# Patient Record
Sex: Male | Born: 1975 | Race: White | Hispanic: No | Marital: Single | Smoking: Former smoker
Health system: Southern US, Community
[De-identification: ages and names within clinical notes are randomized; demographics above are authoritative.]

## PROBLEM LIST (undated history)

## (undated) HISTORY — PX: BACK SURGERY: SHX140

---

## 1998-04-13 ENCOUNTER — Emergency Department (HOSPITAL_COMMUNITY): Admission: EM | Admit: 1998-04-13 | Discharge: 1998-04-13 | Payer: Self-pay | Admitting: Emergency Medicine

## 1999-04-23 ENCOUNTER — Emergency Department (HOSPITAL_COMMUNITY): Admission: EM | Admit: 1999-04-23 | Discharge: 1999-04-23 | Payer: Self-pay | Admitting: *Deleted

## 1999-04-24 ENCOUNTER — Encounter: Payer: Self-pay | Admitting: Emergency Medicine

## 1999-09-20 ENCOUNTER — Emergency Department (HOSPITAL_COMMUNITY): Admission: EM | Admit: 1999-09-20 | Discharge: 1999-09-20 | Payer: Self-pay | Admitting: Emergency Medicine

## 1999-09-20 ENCOUNTER — Encounter: Payer: Self-pay | Admitting: Emergency Medicine

## 1999-09-30 ENCOUNTER — Encounter: Payer: Self-pay | Admitting: Emergency Medicine

## 1999-09-30 ENCOUNTER — Inpatient Hospital Stay (HOSPITAL_COMMUNITY): Admission: EM | Admit: 1999-09-30 | Discharge: 1999-10-03 | Payer: Self-pay | Admitting: Emergency Medicine

## 1999-10-01 ENCOUNTER — Encounter: Payer: Self-pay | Admitting: *Deleted

## 2000-03-28 ENCOUNTER — Emergency Department (HOSPITAL_COMMUNITY): Admission: EM | Admit: 2000-03-28 | Discharge: 2000-03-28 | Payer: Self-pay | Admitting: Emergency Medicine

## 2000-03-28 ENCOUNTER — Encounter: Payer: Self-pay | Admitting: Emergency Medicine

## 2001-01-17 ENCOUNTER — Encounter: Admission: RE | Admit: 2001-01-17 | Discharge: 2001-01-17 | Payer: Self-pay | Admitting: Family Medicine

## 2001-01-17 ENCOUNTER — Encounter: Payer: Self-pay | Admitting: Family Medicine

## 2002-06-12 ENCOUNTER — Emergency Department (HOSPITAL_COMMUNITY): Admission: EM | Admit: 2002-06-12 | Discharge: 2002-06-12 | Payer: Self-pay | Admitting: Emergency Medicine

## 2004-05-21 ENCOUNTER — Emergency Department (HOSPITAL_COMMUNITY): Admission: EM | Admit: 2004-05-21 | Discharge: 2004-05-21 | Payer: Self-pay | Admitting: Emergency Medicine

## 2004-10-25 ENCOUNTER — Emergency Department (HOSPITAL_COMMUNITY): Admission: EM | Admit: 2004-10-25 | Discharge: 2004-10-25 | Payer: Self-pay | Admitting: Family Medicine

## 2007-05-02 ENCOUNTER — Emergency Department (HOSPITAL_COMMUNITY): Admission: EM | Admit: 2007-05-02 | Discharge: 2007-05-02 | Payer: Self-pay | Admitting: Family Medicine

## 2008-07-03 ENCOUNTER — Emergency Department (HOSPITAL_COMMUNITY): Admission: EM | Admit: 2008-07-03 | Discharge: 2008-07-03 | Payer: Self-pay | Admitting: Family Medicine

## 2008-07-14 ENCOUNTER — Emergency Department (HOSPITAL_COMMUNITY): Admission: EM | Admit: 2008-07-14 | Discharge: 2008-07-14 | Payer: Self-pay | Admitting: Emergency Medicine

## 2008-09-22 ENCOUNTER — Ambulatory Visit (HOSPITAL_COMMUNITY): Admission: RE | Admit: 2008-09-22 | Discharge: 2008-09-22 | Payer: Self-pay | Admitting: Family Medicine

## 2008-10-20 ENCOUNTER — Emergency Department: Payer: Self-pay | Admitting: Unknown Physician Specialty

## 2009-04-04 ENCOUNTER — Emergency Department (HOSPITAL_COMMUNITY): Admission: EM | Admit: 2009-04-04 | Discharge: 2009-04-04 | Payer: Self-pay | Admitting: Family Medicine

## 2009-05-28 ENCOUNTER — Emergency Department (HOSPITAL_COMMUNITY): Admission: EM | Admit: 2009-05-28 | Discharge: 2009-05-29 | Payer: Self-pay | Admitting: Emergency Medicine

## 2009-05-29 ENCOUNTER — Emergency Department (HOSPITAL_COMMUNITY): Admission: EM | Admit: 2009-05-29 | Discharge: 2009-05-29 | Payer: Self-pay | Admitting: Emergency Medicine

## 2009-09-08 ENCOUNTER — Emergency Department (HOSPITAL_COMMUNITY): Admission: EM | Admit: 2009-09-08 | Discharge: 2009-09-08 | Payer: Self-pay | Admitting: Family Medicine

## 2009-09-09 ENCOUNTER — Encounter: Admission: RE | Admit: 2009-09-09 | Discharge: 2009-09-09 | Payer: Self-pay | Admitting: Specialist

## 2010-12-05 LAB — POCT I-STAT, CHEM 8
BUN: 10 mg/dL (ref 6–23)
Creatinine, Ser: 0.8 mg/dL (ref 0.4–1.5)
Glucose, Bld: 99 mg/dL (ref 70–99)
Potassium: 3.8 mEq/L (ref 3.5–5.1)
Sodium: 142 mEq/L (ref 135–145)
TCO2: 24 mmol/L (ref 0–100)

## 2011-01-16 NOTE — H&P (Signed)
[place]. [facility]  Patient:    Steven Kramer                        MRN: [id number] Adm. Date:  [id number] Attending:  Sharyn Dross                         History and Physical  HISTORY OF PRESENT ILLNESS:  This 35 year old white gentleman is admitted into he hospital with a history of fever, persistent nausea and vomiting, and generalized pains and weakness ongoing. The patient presented to the office on January 30 for an evaluation for this process. He states that the periods of symptoms have been ongoing for the past several months. Prior to that, the patient had been on intramuscular steroid injections for weight loss control as well as body building process that was ongoing. The patient was doing this as well as ______  food products to lose weight. When the process of what was ongoing, the patient had ost at least 75 pounds of weight that was noted.  Approximately four months ago, the patient had given himself an injection in the arm which caused complication for the patient. Subsequently, he stopped using intramuscular steroid medications. Subsequently, he started to develop severe discomfort such as headaches, dizziness, fevers, and chills, as well as nausea nd vomiting that is present. The patient was seen by the emergency room physician n the past and was treated with medications, such as intravenous Valium and other  medications. The patient stated that these medicines had not helped, and his symptoms have not improved or get any better at that time. An appointment was made to see me in my office at this time at which point the patient explains this scenario. The wife subsequently explained later that the patient has not stopped his steroids but is taking oral steroids at which he is even recently taken.  There is no significant change in his mental status or any violent action or reaction is noted.  FAMILY HISTORY:   Noncontributory.  SOCIAL HISTORY:  The patient admits to smoking a half of a pack of cigarettes per day over the last 4 years and also the patient denies alcohol use that was present at this time.  CURRENT MEDICATIONS:  The patient does not take any medications that is presently noted at this time.  REVIEW OF SYSTEMS:  Positive for severe chest pains and discomfort over the last four months duration without treatment. The patient denies any pulmonary problems. Neurologically, the patient admits to headaches and dizziness. MUSCULOSKELETAL: The patient admits to having severe joint discomforts and problems secondary to withdrawal. The rest of the review of systems appears to be unremarkable, except insomnia.  PHYSICAL EXAMINATION:  GENERAL:  He is a pleasant male appears to be in moderate distress at the time f admission.  VITAL SIGNS:  Stable, except a temperature of 100 degrees.  HEENT:  Anicteric.  NECK:  Supple.  LUNGS:  Appear to be clear to auscultation and percussion.  HEART:  Regular rate and rhythm without heaves, thrills, murmurs or gallops. There was no tachycardia that was noted.  ABDOMEN:  Soft. No tenderness to palpation. No hepatosplenomegaly appreciated.  EXTREMITIES:  Unremarkable.  LABORATORY DATA:  Showed a pH of 7.534, a pCO2 of 19.3, bicarb of 16. A white count of 4.7, red blood cell count 4.99, hemoglobin 14.8, hematocrit 41.6, normal indices noted. Platelet count is 182,000.  Serum sodium level is 138, potassium 4.5, chloride 108, glucose 92, BUN 12, amylase level 90, lipase 27. CPK 267 (abnormal), CK-MB 0.6, troponin I less than 0.03 that is noted.  Chest x-ray shows no active process that is present but a repeat x-ray was consistent with possible bronchitis that is present due to the prominence of a perihilar markings that were appreciated.  GROSS IMPRESSION: 1. Fever. Etiology unknown. Rule out possible sepsis. 2. Possible steroid  withdrawal. 3. Bronchitis.  PRESENT RECOMMENDATIONS: 1. Will admit the patient into the hospital. 2. Start IV therapy on the patient. 3. Obtain a GI series to find out if symptoms are related to gastroesophageal    reflux disease versus other causes. 4. Conservative management during the interim. 5. Decrease or hold on any specific analgesic medication because I do not want o    treat the psychosis or possible withdrawal symptoms with analgesic types of    medications at this time. Depending upon the results, will determine the course    of therapy for the patient. DD:  10/01/99 TD:  10/01/99 Job: [id number]

## 2011-01-16 NOTE — Discharge Summary (Signed)
[place]. [facility]  Patient:    Steven Kramer                        MRN: [id number] Adm. Date:  [id number] Disc. Date: [id number] Attending:  Sharyn Dross                           Discharge Summary  ADMISSION DIAGNOSES: 1. Fever. 2. Steroid withdrawal symptoms.  DISCHARGE DIAGNOSES: 1. Fever. 2. Steroid withdrawal symptoms.  CONDITION ON DISCHARGE:  Stable, improved.  DISCHARGE MEDICATIONS: 1. Elavil 50 mg IV q.h.s. 2. Levaquin 500 mg q.d. for seven days.  COMPLICATIONS:  None at this time.  HISTORY OF PRESENT ILLNESS:  The patient was admitted into the hospital after being extremely agitated and hyperactive at this time.  His initial laboratory work was performed, and the patient was afebrile; however, because the patient was having such severe discomfort, he was sent to the emergency room that evening.  Upon evaluation by the emergency room department, it was decided to admit the patient into the hospital because of a fever that he was having at that time.  HOSPITAL COURSE:  The patient was started on Levaquin 500 mg intravenously at that time.  Within the first 24 hours the temperature gradually abated down towards normal at that point.   The patient was also started with Elavil because of his  severe discomfort, at 50 mg q.h.s.  The patient gradually calmed down with having increased rest, without any complications with sleep that is noted at this time. The patient has been able to eat and tolerates foods without any difficulties at this time.  DISPOSITION: Because his temperature has remained afebrile over the last 48 hours, the patient will be discharged to home on a complete 10-day course of Levaquin t 500 mg q.d. and will be continued on the Elavil at this time.  FOLLOWUP:  He has an appointment to see me on October 22, 1999, for a re-evaluation in the office.  Depending upon that visit, will determine  the further course of therapy that will be noted.  RECOMMENDATIONS TO THE FAMILY:  I have recommended to the patients wife that she continue to work with him to stop his oral steroid medication.  Because if he continues to use this, the patient will have worsening complications, and will o through the same events over again.  The wife understands this at this time, and she will try her best to try to prevent him from doing this.  This will be reinforced both by myself, as well as by my staff with the patient on his follow-up evaluation during the interim.  CLINICAL DIAGNOSIS:  As above.  CLINICAL CONDITION:  Stable, improved. DD:  10/03/99 TD:  10/04/99 Job: [id number]

## 2018-08-11 ENCOUNTER — Encounter: Payer: Self-pay | Admitting: Emergency Medicine

## 2018-08-11 ENCOUNTER — Emergency Department: Payer: BLUE CROSS/BLUE SHIELD

## 2018-08-11 ENCOUNTER — Other Ambulatory Visit: Payer: Self-pay

## 2018-08-11 ENCOUNTER — Emergency Department
Admission: EM | Admit: 2018-08-11 | Discharge: 2018-08-11 | Disposition: A | Discharge status: Home / Self Care | Payer: BLUE CROSS/BLUE SHIELD | Attending: Emergency Medicine | Admitting: Emergency Medicine

## 2018-08-11 DIAGNOSIS — H539 Unspecified visual disturbance: Secondary | ICD-10-CM

## 2018-08-11 DIAGNOSIS — F17210 Nicotine dependence, cigarettes, uncomplicated: Secondary | ICD-10-CM | POA: Insufficient documentation

## 2018-08-11 DIAGNOSIS — M79601 Pain in right arm: Secondary | ICD-10-CM | POA: Diagnosis not present

## 2018-08-11 DIAGNOSIS — R202 Paresthesia of skin: Secondary | ICD-10-CM | POA: Diagnosis not present

## 2018-08-11 DIAGNOSIS — H538 Other visual disturbances: Secondary | ICD-10-CM | POA: Insufficient documentation

## 2018-08-11 LAB — DIFFERENTIAL
Abs Immature Granulocytes: 0.01 10*3/uL (ref 0.00–0.07)
BASOS ABS: 0 10*3/uL (ref 0.0–0.1)
BASOS PCT: 0 %
EOS ABS: 0.2 10*3/uL (ref 0.0–0.5)
Eosinophils Relative: 3 %
Immature Granulocytes: 0 %
Lymphocytes Relative: 36 %
Lymphs Abs: 1.9 10*3/uL (ref 0.7–4.0)
MONO ABS: 0.4 10*3/uL (ref 0.1–1.0)
Monocytes Relative: 8 %
NEUTROS ABS: 2.8 10*3/uL (ref 1.7–7.7)
NEUTROS PCT: 53 %

## 2018-08-11 LAB — TROPONIN I: Troponin I: <0.03 ng/mL (ref <0.03)

## 2018-08-11 LAB — COMPREHENSIVE METABOLIC PANEL
ALBUMIN: 4.2 g/dL (ref 3.5–5.0)
ALT: 15 U/L (ref 0–44)
AST: 16 U/L (ref 15–41)
Alkaline Phosphatase: 74 U/L (ref 38–126)
Anion gap: 7 (ref 5–15)
BILIRUBIN TOTAL: 0.6 mg/dL (ref 0.3–1.2)
BUN: 15 mg/dL (ref 6–20)
CALCIUM: 8.7 mg/dL — AB (ref 8.9–10.3)
CHLORIDE: 106 mmol/L (ref 98–111)
CO2: 23 mmol/L (ref 22–32)
CREATININE: 0.66 mg/dL (ref 0.61–1.24)
GFR calc Af Amer: >60 mL/min (ref >60)
GLUCOSE: 101 mg/dL — AB (ref 70–99)
Potassium: 4 mmol/L (ref 3.5–5.1)
Sodium: 136 mmol/L (ref 135–145)
Total Protein: 7 g/dL (ref 6.5–8.1)

## 2018-08-11 LAB — PROTIME-INR
INR: 0.99
Prothrombin Time: 13 seconds (ref 11.4–15.2)

## 2018-08-11 LAB — CBC
HCT: 45.1 % (ref 39.0–52.0)
Hemoglobin: 15.1 g/dL (ref 13.0–17.0)
MCH: 28.8 pg (ref 26.0–34.0)
MCHC: 33.5 g/dL (ref 30.0–36.0)
MCV: 85.9 fL (ref 80.0–100.0)
Platelets: 231 10*3/uL (ref 150–400)
RBC: 5.25 MIL/uL (ref 4.22–5.81)
RDW: 12.4 % (ref 11.5–15.5)
WBC: 5.3 10*3/uL (ref 4.0–10.5)
nRBC: 0 % (ref 0.0–0.2)

## 2018-08-11 LAB — APTT: aPTT: 30 seconds (ref 24–36)

## 2018-08-11 NOTE — ED Provider Notes (Signed)
[facility] Emergency Department Provider Note       Time seen: ----------------------------------------- 6:16 PM on 08/11/2018 -----------------------------------------   I have reviewed the triage vital signs and the nursing notes.  HISTORY   Chief Complaint No chief complaint on file.    HPI Steven Kramer is a 42 y.o. male with no significant past medical history who presents to the ED for left arm weakness as well as right arm pain and spotty vision has been on and off for several days.  Patient states symptoms started Sunday when he was playing golf.  He denies fevers, chills or other complaints.  History reviewed. No pertinent past medical history.  There are no active problems to display for this patient.   Allergies Patient has no known allergies.  Social History Social History   Tobacco Use  . Smoking status: Current Every Day Smoker    Types: Cigarettes  . Smokeless tobacco: Never Used  Substance Use Topics  . Alcohol use: Not Currently  . Drug use: Not Currently   Review of Systems Constitutional: Negative for fever. Eyes: Positive for vision changes ENT:  Negative for congestion, sore throat Cardiovascular: Negative for chest pain. Respiratory: Negative for shortness of breath. Gastrointestinal: Negative for abdominal pain, vomiting and diarrhea. Musculoskeletal: Positive for arm pain Skin: Negative for rash. Neurological: Negative for headaches, focal weakness or numbness.  All systems negative/normal/unremarkable except as stated in the HPI  ____________________________________________   PHYSICAL EXAM:  VITAL SIGNS: ED Triage Vitals  Enc Vitals Group     BP 08/11/18 1523 (!) 149/89     Pulse Rate 08/11/18 1523 99     Resp 08/11/18 1523 16     Temp 08/11/18 1523 97.7 F (36.5 C)     Temp Source 08/11/18 1523 Oral     SpO2 08/11/18 1523 97 %     Weight 08/11/18 1524 270 lb (122.5 kg)     Height 08/11/18 1524 6'  2" (1.88 m)     Head Circumference --      Peak Flow --      Pain Score 08/11/18 1523 7     Pain Loc --      Pain Edu? --      Excl. in GC? --    Constitutional: Alert and oriented. Well appearing and in no distress. Eyes: Conjunctivae are normal. Normal extraocular movements. ENT   Head: Normocephalic and atraumatic.   Nose: No congestion/rhinnorhea.   Mouth/Throat: Mucous membranes are moist.   Neck: No stridor. Cardiovascular: Normal rate, regular rhythm. No murmurs, rubs, or gallops. Respiratory: Normal respiratory effort without tachypnea nor retractions. Breath sounds are clear and equal bilaterally. No wheezes/rales/rhonchi. Gastrointestinal: Soft and nontender. Normal bowel sounds Musculoskeletal: Nontender with normal range of motion in extremities. No lower extremity tenderness nor edema. Neurologic:  Normal speech and language. No gross focal neurologic deficits are appreciated.  Skin:  Skin is warm, dry and intact. No rash noted. Psychiatric: Mood and affect are normal. Speech and behavior are normal.  ____________________________________________  EKG: Interpreted by me.  Sinus rhythm with a rate of 96 bpm, normal PR interval, normal QRS, normal QT  ____________________________________________  ED COURSE:  As part of my medical decision making, I reviewed the following data within the electronic MEDICAL RECORD NUMBER History obtained from family if available, nursing notes, old chart and ekg, as well as notes from prior ED visits. Patient presented for multiple complaints, we will assess with labs and imaging as indicated  at this time.   Procedures ____________________________________________   LABS (pertinent positives/negatives)  Labs Reviewed  COMPREHENSIVE METABOLIC PANEL - Abnormal; Notable for the following components:      Result Value   Glucose, Bld 101 (*)    Calcium 8.7 (*)    All other components within normal limits  PROTIME-INR  APTT  CBC   DIFFERENTIAL  TROPONIN I  CBG MONITORING, ED    RADIOLOGY  CT head was unremarkable  ____________________________________________  DIFFERENTIAL DIAGNOSIS   CVA, TIA, migraine, paresthesia, peripheral neuropathy, stress, hypertension  FINAL ASSESSMENT AND PLAN  Paresthesia, weakness   Plan: The patient had presented for multiple complaints. Patient's labs were reassuring. Patient's imaging are also reassuring.  No clear etiology for his multiple symptoms.  He is cleared for outpatient follow-up.   Ulice Dash, MD   Note: This note was generated in part or whole with voice recognition software. Voice recognition is usually quite accurate but there are transcription errors that can and very often do occur. I apologize for any typographical errors that were not detected and corrected.     Emily Filbert, MD 08/11/18 [phone]

## 2018-08-11 NOTE — ED Notes (Signed)
Pt in NAD at time of departure, verbalizes d/c understanding and follow up. PT ambulatory. PT unable to sign due to hallway bed and no available e-signature topaz.

## 2018-08-11 NOTE — ED Triage Notes (Signed)
Aives from [facility] for ED evaluation of left arm weakness, onset of symptoms, right arm pain, and blurred / spotty vision.  States symptoms started on Sunday when patient was playing golf.  Patien is AAOx3.  Skin warm and dry.  MAE, left arm weakness noted.  Speech clear.  Facial movements equal.

## 2018-09-08 NOTE — Progress Notes (Deleted)
   Subjective:    Patient ID: Steven Kramer, male    DOB: 1975-10-29, 43 y.o.   MRN: [id number]  HPI:  Steven Kramer is here to establish as a new pt. He is a pleasant 43 year old male. PMH:    Patient Care Team    Relationship Specialty Notifications Start End  Patient, No Pcp Per PCP - General General Practice  08/11/18     There are no active problems to display for this patient.    No past medical history on file.   *** The histories are not reviewed yet. Please review them in the "History" navigator section and refresh this SmartLink.   No family history on file.   Social History   Substance and Sexual Activity  Drug Use Not Currently     Social History   Substance and Sexual Activity  Alcohol Use Not Currently     Social History   Tobacco Use  Smoking Status Current Every Day Smoker  . Types: Cigarettes  Smokeless Tobacco Never Used     No outpatient encounter medications on file as of 09/12/2018.   No facility-administered encounter medications on file as of 09/12/2018.     Allergies: Patient has no known allergies.  There is no height or weight on file to calculate BMI.  There were no vitals taken for this visit.     Review of Systems     Objective:   Physical Exam        Assessment & Plan:  No diagnosis found.  No problem-specific Assessment & Plan notes found for this encounter.    FOLLOW-UP:  No follow-ups on file.

## 2018-09-12 ENCOUNTER — Ambulatory Visit: Payer: Self-pay | Admitting: Adult Health

## 2020-08-08 ENCOUNTER — Other Ambulatory Visit: Payer: Self-pay

## 2020-08-08 DIAGNOSIS — S01511A Laceration without foreign body of lip, initial encounter: Secondary | ICD-10-CM | POA: Insufficient documentation

## 2020-08-08 DIAGNOSIS — X58XXXA Exposure to other specified factors, initial encounter: Secondary | ICD-10-CM | POA: Diagnosis not present

## 2020-08-08 DIAGNOSIS — Z5321 Procedure and treatment not carried out due to patient leaving prior to being seen by health care provider: Secondary | ICD-10-CM | POA: Diagnosis not present

## 2020-08-08 DIAGNOSIS — Y92524 Gas station as the place of occurrence of the external cause: Secondary | ICD-10-CM | POA: Diagnosis not present

## 2020-08-08 DIAGNOSIS — S00501A Unspecified superficial injury of lip, initial encounter: Secondary | ICD-10-CM | POA: Diagnosis present
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
## 2020-08-09 ENCOUNTER — Emergency Department
Admission: EM | Admit: 2020-08-09 | Discharge: 2020-08-09 | Disposition: A | Discharge status: Home / Self Care | Payer: BLUE CROSS/BLUE SHIELD | Attending: Emergency Medicine | Admitting: Emergency Medicine
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
## 2020-08-09 NOTE — ED Notes (Signed)
No answer when called several times from lobby 
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
## 2020-08-14 ENCOUNTER — Emergency Department: Payer: No Typology Code available for payment source

## 2020-08-14 ENCOUNTER — Encounter: Payer: Self-pay | Admitting: Emergency Medicine

## 2020-08-14 ENCOUNTER — Emergency Department
Admission: EM | Admit: 2020-08-14 | Discharge: 2020-08-14 | Disposition: A | Discharge status: Home / Self Care | Payer: No Typology Code available for payment source | Attending: Emergency Medicine | Admitting: Emergency Medicine

## 2020-08-14 ENCOUNTER — Other Ambulatory Visit: Payer: Self-pay

## 2020-08-14 DIAGNOSIS — H5319 Other subjective visual disturbances: Secondary | ICD-10-CM | POA: Insufficient documentation

## 2020-08-14 DIAGNOSIS — S0990XA Unspecified injury of head, initial encounter: Secondary | ICD-10-CM | POA: Diagnosis present

## 2020-08-14 DIAGNOSIS — S060X9A Concussion with loss of consciousness of unspecified duration, initial encounter: Secondary | ICD-10-CM | POA: Diagnosis not present

## 2020-08-14 DIAGNOSIS — Z87891 Personal history of nicotine dependence: Secondary | ICD-10-CM | POA: Diagnosis not present

## 2020-08-14 MED ORDER — ONDANSETRON 4 MG PO TBDP
4.0000 mg | ORAL_TABLET | Freq: Once | ORAL | Status: AC
  Administered 2020-08-14: 16:00:00 4 mg via ORAL
  Filled 2020-08-14: qty 1

## 2020-08-14 MED ORDER — IBUPROFEN 400 MG PO TABS
400.0000 mg | ORAL_TABLET | Freq: Once | ORAL | Status: AC
  Administered 2020-08-14: 17:00:00 400 mg via ORAL
  Filled 2020-08-14: qty 1

## 2020-08-14 MED ORDER — ONDANSETRON HCL 4 MG PO TABS: 4.0000 mg | ORAL_TABLET | Freq: Three times a day (TID) | ORAL | 0 refills | Status: DC | PRN

## 2020-08-14 MED ORDER — ACETAMINOPHEN 500 MG PO TABS
1000.0000 mg | ORAL_TABLET | Freq: Once | ORAL | Status: AC
  Administered 2020-08-14: 16:00:00 1000 mg via ORAL
  Filled 2020-08-14: qty 2
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
## 2020-08-14 NOTE — ED Notes (Signed)
PT in CT.

## 2020-08-14 NOTE — ED Notes (Signed)
Pt calm , collective upon discharge  
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
## 2021-07-15 ENCOUNTER — Ambulatory Visit: Payer: BLUE CROSS/BLUE SHIELD | Admitting: Dermatology
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
## 2023-05-19 ENCOUNTER — Other Ambulatory Visit: Payer: Self-pay | Admitting: General Surgery

## 2023-05-19 DIAGNOSIS — K8020 Calculus of gallbladder without cholecystitis without obstruction: Secondary | ICD-10-CM

## 2023-06-03 ENCOUNTER — Other Ambulatory Visit: Payer: Self-pay

## 2023-06-03 ENCOUNTER — Encounter: Payer: Self-pay | Admitting: Intensive Care

## 2023-06-03 ENCOUNTER — Emergency Department
Admission: EM | Admit: 2023-06-03 | Discharge: 2023-06-03 | Disposition: A | Discharge status: Home / Self Care | Payer: BLUE CROSS/BLUE SHIELD | Attending: Emergency Medicine | Admitting: Emergency Medicine

## 2023-06-03 DIAGNOSIS — I10 Essential (primary) hypertension: Secondary | ICD-10-CM | POA: Insufficient documentation

## 2023-06-03 MED ORDER — HYDROCHLOROTHIAZIDE 25 MG PO TABS: 25.0000 mg | ORAL_TABLET | Freq: Every day | ORAL | 0 refills | Status: DC

## 2023-06-03 NOTE — ED Provider Notes (Signed)
   [facility] Provider Note    Event Date/Time   First MD Initiated Contact with Patient 06/03/23 1328     (approximate)   History   Hypertension   HPI  Steven Kramer is a 47 y.o. male who presents with reports of elevated blood pressure.  Patient was undergoing [employer] physical, blood pressure was elevated so they referred him to the emergency department.  He is asymptomatic has no complaints at this time.  He is not on blood pressure medication.     Physical Exam   Triage Vital Signs: ED Triage Vitals  Encounter Vitals Group     BP 06/03/23 1311 (!) 162/91     Systolic BP Percentile --      Diastolic BP Percentile --      Pulse Rate 06/03/23 1311 (!) 109     Resp 06/03/23 1311 18     Temp 06/03/23 1311 97.9 F (36.6 C)     Temp Source 06/03/23 1311 Oral     SpO2 06/03/23 1311 96 %     Weight 06/03/23 1308 131.5 kg (290 lb)     Height 06/03/23 1308 1.905 m (6\' 3" )     Head Circumference --      Peak Flow --      Pain Score 06/03/23 1308 0     Pain Loc --      Pain Education --      Exclude from Growth Chart --     Most recent vital signs: Vitals:   06/03/23 1311  BP: (!) 162/91  Pulse: (!) 109  Resp: 18  Temp: 97.9 F (36.6 C)  SpO2: 96%     General: Awake, no distress.  CV:  Good peripheral perfusion.  No murmurs Resp:  Normal effort.  Clear to auscultation Abd:  No distention.  Other:     ED Results / Procedures / Treatments   Labs (all labs ordered are listed, but only abnormal results are displayed) Labs Reviewed - No data to display   EKG     RADIOLOGY     PROCEDURES:  Critical Care performed:   Procedures   MEDICATIONS ORDERED IN ED: Medications - No data to display   IMPRESSION / MDM / ASSESSMENT AND PLAN / ED COURSE  I reviewed the triage vital signs and the nursing notes. Patient's presentation is most consistent with acute, uncomplicated illness.  Patient presents  with elevated blood pressure as detailed above, he is well-appearing and asymptomatic.  Blood pressure 162/91  Reasonable to start HCTZ reviewing his records over the years has always been mildly elevated.  I have put in PCP referral as well  I have encouraged him to follow-up closely with PCP for blood pressure follow-up      FINAL CLINICAL IMPRESSION(S) / ED DIAGNOSES   Final diagnoses:  Hypertension, unspecified type     Rx / DC Orders   ED Discharge Orders          Ordered    hydrochlorothiazide (HYDRODIURIL) 25 MG tablet  Daily        06/03/23 1329    Ambulatory Referral to Primary Care (Establish Care)        06/03/23 1444             Note:  This document was prepared using Dragon voice recognition software and may include unintentional dictation errors.   Jene Every, MD 06/03/23 [phone]

## 2023-06-03 NOTE — ED Triage Notes (Signed)
Patient sent by [facility] for hypertension. Denies any symptoms. Denies blood pressure issues

## 2023-06-04 ENCOUNTER — Telehealth: Payer: Self-pay | Admitting: Emergency Medicine

## 2023-06-04 MED ORDER — HYDROCHLOROTHIAZIDE 25 MG PO TABS: 25.0000 mg | ORAL_TABLET | Freq: Every day | ORAL | 0 refills | Status: DC

## 2023-06-04 NOTE — Telephone Encounter (Signed)
Needs printed rx from yesterday

## 2023-06-09 ENCOUNTER — Encounter: Payer: Self-pay | Admitting: Emergency Medicine

## 2023-06-09 ENCOUNTER — Other Ambulatory Visit: Payer: Self-pay

## 2023-06-09 DIAGNOSIS — E871 Hypo-osmolality and hyponatremia: Secondary | ICD-10-CM | POA: Diagnosis present

## 2023-06-09 DIAGNOSIS — X58XXXA Exposure to other specified factors, initial encounter: Secondary | ICD-10-CM | POA: Diagnosis present

## 2023-06-09 DIAGNOSIS — Z6836 Body mass index (BMI) 36.0-36.9, adult: Secondary | ICD-10-CM

## 2023-06-09 DIAGNOSIS — Z87891 Personal history of nicotine dependence: Secondary | ICD-10-CM

## 2023-06-09 DIAGNOSIS — M6282 Rhabdomyolysis: Principal | ICD-10-CM | POA: Diagnosis present

## 2023-06-09 DIAGNOSIS — E669 Obesity, unspecified: Secondary | ICD-10-CM | POA: Diagnosis present

## 2023-06-09 DIAGNOSIS — Z23 Encounter for immunization: Secondary | ICD-10-CM

## 2023-06-09 DIAGNOSIS — I10 Essential (primary) hypertension: Secondary | ICD-10-CM | POA: Diagnosis present

## 2023-06-09 DIAGNOSIS — R631 Polydipsia: Secondary | ICD-10-CM | POA: Diagnosis present

## 2023-06-09 DIAGNOSIS — Z79899 Other long term (current) drug therapy: Secondary | ICD-10-CM

## 2023-06-09 DIAGNOSIS — M62838 Other muscle spasm: Secondary | ICD-10-CM | POA: Diagnosis present

## 2023-06-09 DIAGNOSIS — S7011XA Contusion of right thigh, initial encounter: Secondary | ICD-10-CM | POA: Diagnosis present

## 2023-06-09 MED ORDER — OXYCODONE-ACETAMINOPHEN 5-325 MG PO TABS
1.0000 | ORAL_TABLET | Freq: Once | ORAL | Status: AC
  Administered 2023-06-09: 1 via ORAL
  Filled 2023-06-09: qty 1

## 2023-06-09 NOTE — ED Triage Notes (Signed)
Pt [place] via [facility] with R upper leg/inner thigh pain, onset 3 hrs ago. Pt states it feels like a muscle cramp and the area has some swelling. 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 Ambulatory into triage, +R pedal pulse

## 2023-06-09 NOTE — ED Notes (Signed)
Per Dr. Marisa Severin, no imaging at this time

## 2023-06-10 ENCOUNTER — Inpatient Hospital Stay
Admission: EM | Admit: 2023-06-10 | Discharge: 2023-06-13 | DRG: 558 | Disposition: A | Discharge status: Home / Self Care | Payer: Self-pay | Attending: Student | Admitting: Student

## 2023-06-10 ENCOUNTER — Emergency Department: Payer: Self-pay

## 2023-06-10 DIAGNOSIS — K8020 Calculus of gallbladder without cholecystitis without obstruction: Secondary | ICD-10-CM

## 2023-06-10 DIAGNOSIS — M6282 Rhabdomyolysis: Secondary | ICD-10-CM

## 2023-06-10 DIAGNOSIS — S7011XA Contusion of right thigh, initial encounter: Secondary | ICD-10-CM

## 2023-06-10 DIAGNOSIS — E871 Hypo-osmolality and hyponatremia: Principal | ICD-10-CM | POA: Diagnosis present

## 2023-06-10 LAB — COMPREHENSIVE METABOLIC PANEL
ALT: 52 U/L — ABNORMAL HIGH (ref 0–44)
AST: 72 U/L — ABNORMAL HIGH (ref 15–41)
Albumin: 3.2 g/dL — ABNORMAL LOW (ref 3.5–5.0)
Alkaline Phosphatase: 34 U/L — ABNORMAL LOW (ref 38–126)
Anion gap: 8 (ref 5–15)
BUN: 19 mg/dL (ref 6–20)
CO2: 27 mmol/L (ref 22–32)
Calcium: 7.7 mg/dL — ABNORMAL LOW (ref 8.9–10.3)
Chloride: 90 mmol/L — ABNORMAL LOW (ref 98–111)
Creatinine, Ser: 1.06 mg/dL (ref 0.61–1.24)
GFR, Estimated: >60 mL/min (ref >60)
Glucose, Bld: 122 mg/dL — ABNORMAL HIGH (ref 70–99)
Potassium: 3.7 mmol/L (ref 3.5–5.1)
Sodium: 125 mmol/L — ABNORMAL LOW (ref 135–145)
Total Bilirubin: 1.3 mg/dL — ABNORMAL HIGH (ref 0.3–1.2)
Total Protein: 5.9 g/dL — ABNORMAL LOW (ref 6.5–8.1)

## 2023-06-10 LAB — OSMOLALITY, URINE: Osmolality, Ur: 160 mosm/kg — ABNORMAL LOW (ref 300–900)

## 2023-06-10 LAB — BASIC METABOLIC PANEL
Anion gap: 12 (ref 5–15)
Anion gap: 9 (ref 5–15)
BUN: 17 mg/dL (ref 6–20)
BUN: 18 mg/dL (ref 6–20)
CO2: 26 mmol/L (ref 22–32)
CO2: 28 mmol/L (ref 22–32)
Calcium: 8.2 mg/dL — ABNORMAL LOW (ref 8.9–10.3)
Calcium: 7.9 mg/dL — ABNORMAL LOW (ref 8.9–10.3)
Chloride: 84 mmol/L — ABNORMAL LOW (ref 98–111)
Chloride: 86 mmol/L — ABNORMAL LOW (ref 98–111)
Creatinine, Ser: 1.04 mg/dL (ref 0.61–1.24)
Creatinine, Ser: 1.1 mg/dL (ref 0.61–1.24)
GFR, Estimated: >60 mL/min (ref >60)
GFR, Estimated: >60 mL/min (ref >60)
Glucose, Bld: 137 mg/dL — ABNORMAL HIGH (ref 70–99)
Glucose, Bld: 96 mg/dL (ref 70–99)
Potassium: 3.4 mmol/L — ABNORMAL LOW (ref 3.5–5.1)
Potassium: 3.8 mmol/L (ref 3.5–5.1)
Sodium: 122 mmol/L — ABNORMAL LOW (ref 135–145)
Sodium: 123 mmol/L — ABNORMAL LOW (ref 135–145)

## 2023-06-10 LAB — CBC
HCT: 40.7 % (ref 39.0–52.0)
Hemoglobin: 13.8 g/dL (ref 13.0–17.0)
MCH: 27 pg (ref 26.0–34.0)
MCHC: 33.9 g/dL (ref 30.0–36.0)
MCV: 79.5 fL — ABNORMAL LOW (ref 80.0–100.0)
Platelets: 340 10*3/uL (ref 150–400)
RBC: 5.12 MIL/uL (ref 4.22–5.81)
RDW: 15.8 % — ABNORMAL HIGH (ref 11.5–15.5)
WBC: 14.2 10*3/uL — ABNORMAL HIGH (ref 4.0–10.5)
nRBC: 0 % (ref 0.0–0.2)

## 2023-06-10 LAB — HIV ANTIBODY (ROUTINE TESTING W REFLEX): HIV Screen 4th Generation wRfx: NONREACTIVE

## 2023-06-10 LAB — MAGNESIUM
Magnesium: 1.3 mg/dL — ABNORMAL LOW (ref 1.7–2.4)
Magnesium: 1.7 mg/dL (ref 1.7–2.4)

## 2023-06-10 LAB — SODIUM, URINE, RANDOM: Sodium, Ur: 22 mmol/L

## 2023-06-10 LAB — PHOSPHORUS: Phosphorus: 2.4 mg/dL — ABNORMAL LOW (ref 2.5–4.6)

## 2023-06-10 LAB — CK: Total CK: 2161 U/L — ABNORMAL HIGH (ref 49–397)

## 2023-06-10 LAB — VITAMIN D 25 HYDROXY (VIT D DEFICIENCY, FRACTURES): Vit D, 25-Hydroxy: 47.4 ng/mL (ref 30–100)

## 2023-06-10 MED ORDER — ENOXAPARIN SODIUM 40 MG/0.4ML IJ SOSY: 40.0000 mg | PREFILLED_SYRINGE | INTRAMUSCULAR | Status: DC

## 2023-06-10 MED ORDER — MAGNESIUM SULFATE 2 GM/50ML IV SOLN
2.0000 g | Freq: Once | INTRAVENOUS | Status: AC
  Administered 2023-06-10: 2 g via INTRAVENOUS
  Filled 2023-06-10: qty 50

## 2023-06-10 MED ORDER — MORPHINE SULFATE (PF) 2 MG/ML IV SOLN
2.0000 mg | INTRAVENOUS | Status: DC | PRN
  Administered 2023-06-12 – 2023-06-13 (×4): 2 mg via INTRAVENOUS
  Filled 2023-06-10 (×4): qty 1

## 2023-06-10 MED ORDER — ONDANSETRON HCL 4 MG PO TABS: 4.0000 mg | ORAL_TABLET | Freq: Four times a day (QID) | ORAL | Status: DC | PRN

## 2023-06-10 MED ORDER — ONDANSETRON HCL 4 MG/2ML IJ SOLN
4.0000 mg | Freq: Once | INTRAMUSCULAR | Status: AC
  Administered 2023-06-10: 4 mg via INTRAVENOUS
  Filled 2023-06-10: qty 2

## 2023-06-10 MED ORDER — KETOROLAC TROMETHAMINE 30 MG/ML IJ SOLN
15.0000 mg | Freq: Once | INTRAMUSCULAR | Status: AC
Start: 2023-06-10 — End: 2023-06-10
  Administered 2023-06-10: 15 mg via INTRAVENOUS
  Filled 2023-06-10: qty 1

## 2023-06-10 MED ORDER — CALCIUM CARBONATE ANTACID 500 MG PO CHEW
1.0000 | CHEWABLE_TABLET | Freq: Once | ORAL | Status: AC
  Administered 2023-06-10: 200 mg via ORAL
  Filled 2023-06-10: qty 1

## 2023-06-10 MED ORDER — LACTATED RINGERS IV BOLUS: 1000.0000 mL | Freq: Once | INTRAVENOUS | Status: DC

## 2023-06-10 MED ORDER — SODIUM CHLORIDE 0.9 % IV SOLN: INTRAVENOUS | Status: AC

## 2023-06-10 MED ORDER — METHOCARBAMOL 1000 MG/10ML IJ SOLN
500.0000 mg | Freq: Four times a day (QID) | INTRAVENOUS | Status: DC | PRN
  Administered 2023-06-10 – 2023-06-12 (×3): 500 mg via INTRAVENOUS
  Filled 2023-06-10 (×2): qty 500
  Filled 2023-06-10: qty 5

## 2023-06-10 MED ORDER — ACETAMINOPHEN 325 MG PO TABS: 650.0000 mg | ORAL_TABLET | Freq: Four times a day (QID) | ORAL | Status: DC | PRN

## 2023-06-10 MED ORDER — SODIUM CHLORIDE 0.9 % IV SOLN: INTRAVENOUS | Status: DC

## 2023-06-10 MED ORDER — ENSURE PRE-SURGERY PO LIQD
296.0000 mL | Freq: Once | ORAL | Status: DC
  Filled 2023-06-10: qty 296

## 2023-06-10 MED ORDER — LOSARTAN POTASSIUM 50 MG PO TABS
50.0000 mg | ORAL_TABLET | Freq: Every day | ORAL | Status: DC
  Administered 2023-06-10 – 2023-06-13 (×3): 50 mg via ORAL
  Filled 2023-06-10 (×4): qty 1

## 2023-06-10 MED ORDER — CHLORHEXIDINE GLUCONATE CLOTH 2 % EX PADS
6.0000 | MEDICATED_PAD | Freq: Once | CUTANEOUS | Status: DC
  Filled 2023-06-10: qty 6

## 2023-06-10 MED ORDER — HYDROMORPHONE HCL 1 MG/ML IJ SOLN
0.5000 mg | Freq: Once | INTRAMUSCULAR | Status: AC
  Administered 2023-06-10: 0.5 mg via INTRAVENOUS
  Filled 2023-06-10: qty 0.5

## 2023-06-10 MED ORDER — ONDANSETRON HCL 4 MG/2ML IJ SOLN: 4.0000 mg | Freq: Four times a day (QID) | INTRAMUSCULAR | Status: DC | PRN

## 2023-06-10 MED ORDER — ORAL CARE MOUTH RINSE: 15.0000 mL | OROMUCOSAL | Status: DC | PRN

## 2023-06-10 MED ORDER — CEFAZOLIN SODIUM-DEXTROSE 2-4 GM/100ML-% IV SOLN
2.0000 g | INTRAVENOUS | Status: DC
  Filled 2023-06-10: qty 100

## 2023-06-10 MED ORDER — DIAZEPAM 5 MG/ML IJ SOLN
5.0000 mg | Freq: Once | INTRAMUSCULAR | Status: AC
  Administered 2023-06-10: 5 mg via INTRAVENOUS
  Filled 2023-06-10: qty 2

## 2023-06-10 MED ORDER — HYDROCODONE-ACETAMINOPHEN 5-325 MG PO TABS
1.0000 | ORAL_TABLET | ORAL | Status: DC | PRN
  Administered 2023-06-10 – 2023-06-12 (×6): 2 via ORAL
  Filled 2023-06-10 (×6): qty 2

## 2023-06-10 MED ORDER — SODIUM CHLORIDE 0.9 % IV BOLUS
1000.0000 mL | Freq: Once | INTRAVENOUS | Status: AC
  Administered 2023-06-10: 1000 mL via INTRAVENOUS

## 2023-06-10 MED ORDER — ACETAMINOPHEN 500 MG PO TABS: 1000.0000 mg | ORAL_TABLET | ORAL | Status: DC

## 2023-06-10 MED ORDER — ACETAMINOPHEN 650 MG RE SUPP: 650.0000 mg | Freq: Four times a day (QID) | RECTAL | Status: DC | PRN

## 2023-06-10 NOTE — H&P (Addendum)
History and Physical    Patient: Steven Kramer:[id number] DOB: 10/09/1975 DOA: 06/10/2023 DOS: the patient was seen and examined on 06/10/2023 PCP: Patient, No Pcp Per  Patient coming from: Home  Chief Complaint:  Chief Complaint  Patient presents with   Leg Pain    HPI: Steven Kramer is a 47 y.o. male with medical history significant for No significant past medical history, who presents by EMS from a truck stop with 3-hour onset of severe right thigh pain associated with swelling.  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  He has been injecting the steroids, alternating into his left and right thigh and has been on and off the steroids for years.  He was previously in his usual state state of health prior to the onset of symptoms. ED course and data review: BP 125/109 with otherwise normal vitals Labs: BMP with sodium 122, potassium 3.4.  Magnesium 1.3 and CK 2061 Lower extremity venous ultrasound done, results unavailable Patient treated with Toradol, oxycodone hydromorphone. Patient was given an NS bolus as well as diazepam and magnesium repletion Hospitalist consulted for admission.   Review of Systems: As mentioned in the history of present illness. All other systems reviewed and are negative.  History reviewed. No pertinent past medical history. Past Surgical History:  Procedure Laterality Date   BACK SURGERY     Social History:  reports that he has quit smoking. His smoking use included cigarettes. He has never used smokeless tobacco. He reports that he does not currently use alcohol. He reports that he does not currently use drugs.  No Known Allergies  History reviewed. No pertinent family history.  Prior to Admission medications   Medication Sig Start Date End Date Taking? Authorizing Provider  hydrochlorothiazide (HYDRODIURIL) 25 MG tablet Take 1 tablet (25 mg total) by mouth daily. 06/04/23   Sharman Cheek,  MD  ondansetron (ZOFRAN) 4 MG tablet Take 1 tablet (4 mg total) by mouth every 8 (eight) hours as needed for up to 10 doses for nausea or vomiting. 08/14/20   Gilles Chiquito, MD    Physical Exam: Vitals:   06/09/23 2124 06/09/23 2125 06/09/23 2128  BP: (!) 125/109    Pulse: 99    Resp: 20    Temp: 98.8 F (37.1 C)    TempSrc: Oral    SpO2: 98%    Weight:  131.5 kg 131.5 kg   Physical Exam Vitals and nursing note reviewed.  Constitutional:      General: He is not in acute distress. HENT:     Head: Normocephalic and atraumatic.  Cardiovascular:     Rate and Rhythm: Normal rate and regular rhythm.     Heart sounds: Normal heart sounds.  Pulmonary:     Effort: Pulmonary effort is normal.     Breath sounds: Normal breath sounds.  Abdominal:     Palpations: Abdomen is soft.     Tenderness: There is no abdominal tenderness.  Musculoskeletal:     Comments: Right inner thigh, tender and somewhat indurated, nonfluctuant,   Neurological:     Mental Status: Mental status is at baseline.        Labs on Admission: I have personally reviewed following labs and imaging studies  CBC: No results for input(s): "WBC", "NEUTROABS", "HGB", "HCT", "MCV", "PLT" in the last 168 hours. Basic Metabolic Panel: Recent Labs  Lab 06/10/23 0140  NA 122*  K 3.4*  CL  84*  CO2 26  GLUCOSE 137*  BUN 17  CREATININE 1.04  CALCIUM 8.2*  MG 1.3*   GFR: Estimated Creatinine Clearance: 126.9 mL/min (by C-G formula based on SCr of 1.04 mg/dL). Liver Function Tests: No results for input(s): "AST", "ALT", "ALKPHOS", "BILITOT", "PROT", "ALBUMIN" in the last 168 hours. No results for input(s): "LIPASE", "AMYLASE" in the last 168 hours. No results for input(s): "AMMONIA" in the last 168 hours. Coagulation Profile: No results for input(s): "INR", "PROTIME" in the last 168 hours. Cardiac Enzymes: Recent Labs  Lab 06/10/23 0140  CKTOTAL 2,161*   BNP (last 3 results) No results for input(s):  "PROBNP" in the last 8760 hours. HbA1C: No results for input(s): "HGBA1C" in the last 72 hours. CBG: No results for input(s): "GLUCAP" in the last 168 hours. Lipid Profile: No results for input(s): "CHOL", "HDL", "LDLCALC", "TRIG", "CHOLHDL", "LDLDIRECT" in the last 72 hours. Thyroid Function Tests: No results for input(s): "TSH", "T4TOTAL", "FREET4", "T3FREE", "THYROIDAB" in the last 72 hours. Anemia Panel: No results for input(s): "VITAMINB12", "FOLATE", "FERRITIN", "TIBC", "IRON", "RETICCTPCT" in the last 72 hours. Urine analysis: No results found for: "COLORURINE", "APPEARANCEUR", "LABSPEC", "PHURINE", "GLUCOSEU", "HGBUR", "BILIRUBINUR", "KETONESUR", "PROTEINUR", "UROBILINOGEN", "NITRITE", "LEUKOCYTESUR"  Radiological Exams on Admission: No results found.   Data Reviewed: Relevant notes from primary care and specialist visits, past discharge summaries as available in EHR, including Care Everywhere. Prior diagnostic testing as pertinent to current admission diagnoses Updated medications and problem lists for reconciliation ED course, including vitals, labs, imaging, treatment and response to treatment Triage notes, nursing and pharmacy notes and ED provider's notes Notable results as noted in HPI   Assessment and Plan: * Hyponatremia Uncertain etiology Will get urine osmolality, serum osmolality and urine sodium Continue IV hydration with NS for another liter Pain control and muscle relaxants   Fluid collection right thigh uncertain etiology, possible hematoma, right, initial encounter Recreational injectable anabolic steroid use Patient has been injecting steroids to the right and left thigh off and on for years Right venous ultrasound showing no DVT but showing "Complex fluid collection in the right medial thigh measuring up to 15.6 cm, favored to represent a hematoma. Follow-up in 4-6 weeks is recommended to ensure resolution and exclude underlying soft tissue  mass" Consider CT imaging Will avoid Lovenox/heparin for DVT prophylaxis Can consider surgical evaluation for hematoma evacuation  Rhabdomyolysis CK over 2000 Oral and IV hydration    DVT prophylaxis: Early ambulation  Consults: none  Advance Care Planning: full code  Family Communication: none  Disposition Plan: Back to previous home environment  Severity of Illness: The appropriate patient status for this patient is OBSERVATION. Observation status is judged to be reasonable and necessary in order to provide the required intensity of service to ensure the patient's safety. The patient's presenting symptoms, physical exam findings, and initial radiographic and laboratory data in the context of their medical condition is felt to place them at decreased risk for further clinical deterioration. Furthermore, it is anticipated that the patient will be medically stable for discharge from the hospital within 2 midnights of admission.   Author: Andris Baumann, MD 06/10/2023 3:24 AM  For on call review [email or web address].

## 2023-06-10 NOTE — ED Notes (Signed)
PT endorsing burning sensation following robaxin administration. PT's bed modified for comfort and pt encouraged to eat banana at bedside, relief following shortly after. Pt reminded to remain recumbent and compliant with said instructions.

## 2023-06-10 NOTE — Assessment & Plan Note (Signed)
Uncertain etiology Will get urine osmolality, serum osmolality and urine sodium Continue IV hydration with NS for another liter Pain control and muscle relaxants

## 2023-06-10 NOTE — Plan of Care (Signed)

## 2023-06-10 NOTE — Plan of Care (Signed)
Patient was seen and examined at bedside, patient feels improvement in the right thigh cramps.  Denied any worsening of symptoms, pain is under control.  Patient was admitted due to hyponatremia, pain in the right thigh, rhabdomyolysis and right thigh hematoma.  Patient was using steroid injections. Continue IV fluid and current treatment, we will continue to monitor labs

## 2023-06-10 NOTE — ED Provider Notes (Signed)
[facility] Provider Note    Event Date/Time   First MD Initiated Contact with Patient 06/10/23 0117     (approximate)   History   Leg Pain   HPI  Steven Kramer is a 47 y.o. male who presents to the ED for evaluation of Leg Pain   Patient presents to the ED for evaluation of severe right thigh pain and muscle cramps that started suddenly this evening, about 5 hours ago, while he was seated.  He is a [employer], he was seated and just watching YouTube when the pain suddenly started with spasms within the right thigh.   He reports persistent spasming pain of the right thigh since then.  No other areas of pain, no falls or injuries.  Still ambulatory, but reports fear of worsening spasms.  He does tell me that he injects anabolic steroids into his right thigh once per week, last injection was 2 days ago.  He has never had this issue before.  No fevers or localized areas of swelling or redness  Also reports "chugging like 5 gallons of water" in the last 5 hours since the pain started  Non-smoker, no trauma  Physical Exam   Triage Vital Signs: ED Triage Vitals  Encounter Vitals Group     BP 06/09/23 2124 (!) 125/109     Systolic BP Percentile --      Diastolic BP Percentile --      Pulse Rate 06/09/23 2124 99     Resp 06/09/23 2124 20     Temp 06/09/23 2124 98.8 F (37.1 C)     Temp Source 06/09/23 2124 Oral     SpO2 06/09/23 2124 98 %     Weight 06/09/23 2125 290 lb (131.5 kg)     Height --      Head Circumference --      Peak Flow --      Pain Score 06/09/23 2127 10     Pain Loc --      Pain Education --      Exclude from Growth Chart --     Most recent vital signs: Vitals:   06/09/23 2124  BP: (!) 125/109  Pulse: 99  Resp: 20  Temp: 98.8 F (37.1 C)  SpO2: 98%    General: Awake, no distress.  Obese and clearly uncomfortable, having trouble sitting still.  Able to stand independently, ambulate CV:  Good peripheral perfusion.   Resp:  Normal effort.  Abd:  No distention.  MSK:  No deformity noted.  Neuro:  No focal deficits appreciated. Other:  Right lower leg is symmetric to the left without any signs of swelling, ischemia.  Strong symmetric DP pulse. Palpable muscular spasm to the right quadriceps musculature, notably asymmetric when compared to the left side that is more soft No signs of lower leg or calf pathology To the right popliteal fossa there is an area of ecchymosis and bruising and he has increased or worsening tenderness to palpation around the distal hamstrings (posterior) when compared to anterior quadriceps   ED Results / Procedures / Treatments   Labs (all labs ordered are listed, but only abnormal results are displayed) Labs Reviewed  BASIC METABOLIC PANEL - Abnormal; Notable for the following components:      Result Value   Sodium 122 (*)    Potassium 3.4 (*)    Chloride 84 (*)    Glucose, Bld 137 (*)    Calcium 8.2 (*)  All other components within normal limits  MAGNESIUM - Abnormal; Notable for the following components:   Magnesium 1.3 (*)    All other components within normal limits  CK - Abnormal; Notable for the following components:   Total CK 2,161 (*)    All other components within normal limits  OSMOLALITY, URINE - Abnormal; Notable for the following components:   Osmolality, Ur 160 (*)    All other components within normal limits  SODIUM, URINE, RANDOM  OSMOLALITY    EKG   RADIOLOGY Venous ultrasound of the right leg interpreted by me without DVT  Official radiology report(s): US Venous Img Lower Unilateral Right  Result Date: 06/10/2023 CLINICAL DATA:  Pain, bruising, swelling right leg EXAM: Right LOWER EXTREMITY VENOUS DOPPLER ULTRASOUND TECHNIQUE: Gray-scale sonography with compression, as well as color and duplex ultrasound, were performed to evaluate the deep venous system(s) from the level of the common femoral vein through the popliteal and proximal calf  veins. COMPARISON:  None Available. FINDINGS: VENOUS Normal compressibility of the common femoral, superficial femoral, and popliteal veins, as well as the visualized calf veins. Visualized portions of profunda femoral vein and great saphenous vein unremarkable. No filling defects to suggest DVT on grayscale or color Doppler imaging. Doppler waveforms show normal direction of venous flow, normal respiratory plasticity and response to augmentation. Limited views of the contralateral common femoral vein are unremarkable. OTHER Targeted ultrasound in the area of pain and bruising in the right medial thigh corresponds to a heterogenous predominantly hypoechoic complex fluid collection measuring 15.6 x 3.2 x 4.2 cm. This extends from the upper to lower medial thigh. No internal vascularity. Limitations: none IMPRESSION: 1. No evidence of right lower extremity DVT. 2. Complex fluid collection in the right medial thigh measuring up to 15.6 cm, favored to represent a hematoma. Follow-up in 4-6 weeks is recommended to ensure resolution and exclude underlying soft tissue mass. Electronically Signed   By: Minerva Fester M.D.   On: 06/10/2023 03:28    PROCEDURES and INTERVENTIONS:  .Critical Care  Performed by: Delton Prairie, MD Authorized by: Delton Prairie, MD   Critical care provider statement:    Critical care time (minutes):  30   Critical care time was exclusive of:  Separately billable procedures and treating other patients   Critical care was necessary to treat or prevent imminent or life-threatening deterioration of the following conditions:  Endocrine crisis   Critical care was time spent personally by me on the following activities:  Development of treatment plan with patient or surrogate, discussions with consultants, evaluation of patient's response to treatment, examination of patient, ordering and review of laboratory studies, ordering and review of radiographic studies, ordering and performing  treatments and interventions, pulse oximetry, re-evaluation of patient's condition and review of old charts   Medications  oxyCODONE-acetaminophen (PERCOCET/ROXICET) 5-325 MG per tablet 1 tablet (1 tablet Oral Given 06/09/23 2137)  ketorolac (TORADOL) 30 MG/ML injection 15 mg (15 mg Intravenous Given 06/10/23 0148)  diazepam (VALIUM) injection 5 mg (5 mg Intravenous Given 06/10/23 0149)  HYDROmorphone (DILAUDID) injection 0.5 mg (0.5 mg Intravenous Given 06/10/23 0217)  ondansetron (ZOFRAN) injection 4 mg (4 mg Intravenous Given 06/10/23 0217)  sodium chloride 0.9 % bolus 1,000 mL (0 mLs Intravenous Stopped 06/10/23 0322)  magnesium sulfate IVPB 2 g 50 mL (0 g Intravenous Stopped 06/10/23 0323)  HYDROmorphone (DILAUDID) injection 0.5 mg (0.5 mg Intravenous Given 06/10/23 0325)     IMPRESSION / MDM / ASSESSMENT AND PLAN / ED COURSE  I reviewed the triage vital signs and the nursing notes.  Differential diagnosis includes, but is not limited to, muscular spasm, electrolyte derangement, arterial ischemia  {Patient presents with symptoms of an acute illness or injury that is potentially life-threatening.  Patient presents with apparently atraumatic right thigh pain with evidence of significant electrolyte derangements and rhabdo requiring medical admission.  No evidence of vascular deficits on exam, but palpable muscular spasm and popliteal bruising is noted.  Ultrasound without DVT, but does have signs of a hematoma, possibly from his regular injections of anabolic steroids.  Blood work with hyponatremia, hypomagnesemia and evidence of rhabdomyolysis.  We will initiate fluid resuscitation with normal saline, magnesium replacement IV and consult medicine for admission.  Clinical Course as of 06/10/23 0408  Thu Jun 10, 2023  0210 Reassessed and updated patient of blood work results and my recommendation for admission, he is agreeable [DS]    Clinical Course User Index [DS] Delton Prairie, MD      FINAL CLINICAL IMPRESSION(S) / ED DIAGNOSES   Final diagnoses:  Hyponatremia  Hypomagnesemia  Non-traumatic rhabdomyolysis     Rx / DC Orders   ED Discharge Orders     None        Note:  This document was prepared using Dragon voice recognition software and may include unintentional dictation errors.   Delton Prairie, MD 06/10/23 [phone]

## 2023-06-10 NOTE — Assessment & Plan Note (Addendum)
CK over 2000 Oral and IV hydration

## 2023-06-10 NOTE — Assessment & Plan Note (Addendum)
Recreational injectable anabolic steroid use Patient has been injecting steroids to the right and left thigh off and on for years Right venous ultrasound showing no DVT but showing "Complex fluid collection in the right medial thigh measuring up to 15.6 cm, favored to represent a hematoma. Follow-up in 4-6 weeks is recommended to ensure resolution and exclude underlying soft tissue mass" Consider CT imaging Will avoid Lovenox/heparin for DVT prophylaxis Can consider surgical evaluation for hematoma evacuation

## 2023-06-11 LAB — BASIC METABOLIC PANEL
Anion gap: 11 (ref 5–15)
Anion gap: 8 (ref 5–15)
BUN: 20 mg/dL (ref 6–20)
BUN: 22 mg/dL — ABNORMAL HIGH (ref 6–20)
CO2: 26 mmol/L (ref 22–32)
CO2: 29 mmol/L (ref 22–32)
Calcium: 7.9 mg/dL — ABNORMAL LOW (ref 8.9–10.3)
Calcium: 7.8 mg/dL — ABNORMAL LOW (ref 8.9–10.3)
Chloride: 90 mmol/L — ABNORMAL LOW (ref 98–111)
Chloride: 95 mmol/L — ABNORMAL LOW (ref 98–111)
Creatinine, Ser: 1.18 mg/dL (ref 0.61–1.24)
Creatinine, Ser: 1.17 mg/dL (ref 0.61–1.24)
GFR, Estimated: >60 mL/min (ref >60)
GFR, Estimated: >60 mL/min (ref >60)
Glucose, Bld: 86 mg/dL (ref 70–99)
Glucose, Bld: 96 mg/dL (ref 70–99)
Potassium: 4 mmol/L (ref 3.5–5.1)
Potassium: 3.9 mmol/L (ref 3.5–5.1)
Sodium: 127 mmol/L — ABNORMAL LOW (ref 135–145)
Sodium: 132 mmol/L — ABNORMAL LOW (ref 135–145)

## 2023-06-11 LAB — CBC
HCT: 37.3 % — ABNORMAL LOW (ref 39.0–52.0)
Hemoglobin: 12.6 g/dL — ABNORMAL LOW (ref 13.0–17.0)
MCH: 27 pg (ref 26.0–34.0)
MCHC: 33.8 g/dL (ref 30.0–36.0)
MCV: 80 fL (ref 80.0–100.0)
Platelets: 293 10*3/uL (ref 150–400)
RBC: 4.66 MIL/uL (ref 4.22–5.81)
RDW: 16.1 % — ABNORMAL HIGH (ref 11.5–15.5)
WBC: 11 10*3/uL — ABNORMAL HIGH (ref 4.0–10.5)
nRBC: 0 % (ref 0.0–0.2)

## 2023-06-11 LAB — MAGNESIUM: Magnesium: 2 mg/dL (ref 1.7–2.4)

## 2023-06-11 LAB — CK: Total CK: 1881 U/L — ABNORMAL HIGH (ref 49–397)

## 2023-06-11 LAB — BRAIN NATRIURETIC PEPTIDE: B Natriuretic Peptide: 19.9 pg/mL (ref 0.0–100.0)

## 2023-06-11 LAB — PHOSPHORUS: Phosphorus: 2.8 mg/dL (ref 2.5–4.6)

## 2023-06-11 MED ORDER — INFLUENZA VIRUS VACC SPLIT PF (FLUZONE) 0.5 ML IM SUSY: 0.5000 mL | PREFILLED_SYRINGE | INTRAMUSCULAR | Status: DC

## 2023-06-11 MED ORDER — SODIUM CHLORIDE 0.9 % IV SOLN: INTRAVENOUS | Status: DC

## 2023-06-11 NOTE — Progress Notes (Signed)
[facility] Progress Note  Patient: Steven Kramer    JXB:[id number]  DOA: 06/10/2023     Date of Service: the patient was seen and examined on 06/11/2023  Chief Complaint  Patient presents with   Leg Pain   Brief hospital course: Steven Kramer is a 47 y.o. male with medical history significant for No significant past medical history, who [place] by [place] truck stop with 3-hour onset of severe right thigh pain associated with swelling.  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  He has been injecting the steroids, alternating into his left and right thigh and has been on and off the steroids for years.  He was previously in his usual state state of health prior to the onset of symptoms. ED course and data review: BP 125/109 with otherwise normal vitals Labs: BMP with sodium 122, potassium 3.4.  Magnesium 1.3 and CK 2061 Lower extremity venous ultrasound done, results unavailable Patient treated with Toradol, oxycodone hydromorphone. Patient was given an NS bolus as well as diazepam and magnesium repletion Hospitalist consulted for admission.    Assessment and Plan:  Hypotonic hyponatremia secondary to polydipsia Patient said that he drinks too much of the water Serum osmolality Sodium level 122---132 gradually improving Monitor sodium level   Rhabdomyolysis CK level 2161 on admission, CK18 81 gradually improving Continue IV fluid for hydration Trend CK level Continue as needed medication for pain control   Right thigh hematoma Possible due to Recreational injectable anabolic steroid use  Patient denies any trauma or fall Continue PRN medications for pain control ight venous ultrasound showing no DVT but showing "Complex fluid collection in the right medial thigh measuring up to 15.6 cm, favored to represent a hematoma. Follow-up in 4-6 weeks is recommended to ensure resolution and exclude underlying soft tissue  mass" avoided Lovenox/heparin for DVT prophylaxis, continue SCDs  Mildly elevated LFTs, unknown cause Repeat LFTs tomorrow a.m.  Hypertension, patient is not on any medication at home Started losartan 50 mg p.o. daily Monitor BP and titrate medications accordingly  Hypophosphatemia, resolved Hypomagnesemia, resolved  Leukocytosis most likely reactive WBC 14.2-11 Gradually improving  Body mass index is 36.24 kg/m.  Interventions:  Diet: Heart healthy diet DVT Prophylaxis: SCD, pharmacological prophylaxis contraindicated due to hematoma    Advance goals of care discussion: Full code  Family Communication: family was not present at bedside, at the time of interview.  The pt provided permission to discuss medical plan with the family. Opportunity was given to ask question and all questions were answered satisfactorily.   Disposition:  Pt is from Home, admitted with right thigh pain, found to have rhabdomyolysis, hematoma right thigh and hyponatremia, still has elevated CK and low sodium, which precludes a safe discharge. Discharge to home, when stable, most likely in 1 to 2 days.  Subjective: No significant events overnight, patient feels improvement in the pain currently pain is 6/10 in the right thigh.  Patient denied any chest pain or palpitation, shortness of breath, no any other complaints.  Physical Exam: General: NAD, lying comfortably Appear in no distress, affect appropriate Eyes: PERRLA ENT: Oral Mucosa Clear, moist  Neck: no JVD,  Cardiovascular: S1 and S2 Present, no Murmur,  Respiratory: good respiratory effort, Bilateral Air entry equal and Decreased, no Crackles, no wheezes Abdomen: Bowel Sound present, Soft and no tenderness,  Skin: no rashes Extremities: no Pedal edema, no calf tenderness, right thigh tenderness and swelling  Neurologic: without any new focal findings Gait not checked due to patient safety concerns  Vitals:   06/10/23 2034 06/11/23 0401  06/11/23 0803 06/11/23 1352  BP: (!) 111/56 129/75 126/68   Pulse: (!) 108 73 94   Resp: 18 18 20    Temp: 98.3 F (36.8 C) 98.2 F (36.8 C) 98.4 F (36.9 C)   TempSrc: Oral Oral    SpO2: 95% 95% 95%   Weight:    131.5 kg  Height:    6\' 3"  (1.905 m)    Intake/Output Summary (Last 24 hours) at 06/11/2023 1421 Last data filed at 06/11/2023 1408 Gross per 24 hour  Intake 2048.3 ml  Output --  Net 2048.3 ml   Filed Weights   06/09/23 2125 06/09/23 2128 06/11/23 1352  Weight: 131.5 kg 131.5 kg 131.5 kg    Data Reviewed: I have personally reviewed and interpreted daily labs, tele strips, imagings as discussed above. I reviewed all nursing notes, pharmacy notes, vitals, pertinent old records I have discussed plan of care as described above with RN and patient/family.  CBC: Recent Labs  Lab 06/10/23 1325 06/11/23 0452  WBC 14.2* 11.0*  HGB 13.8 12.6*  HCT 40.7 37.3*  MCV 79.5* 80.0  PLT 340 293   Basic Metabolic Panel: Recent Labs  Lab 06/10/23 0140 06/10/23 1325 06/10/23 1446 06/10/23 2038 06/11/23 0452  NA 122* 123* 125* 127* 132*  K 3.4* 3.8 3.7 4.0 3.9  CL 84* 86* 90* 90* 95*  CO2 26 28 27 26 29   GLUCOSE 137* 96 122* 86 96  BUN 17 18 19 20  22*  CREATININE 1.04 1.10 1.06 1.18 1.17  CALCIUM 8.2* 7.9* 7.7* 7.9* 7.8*  MG 1.3* 1.7  --   --  2.0  PHOS  --  2.4*  --   --  2.8    Studies: No results found.  Scheduled Meds:  feeding supplement  296 mL Oral Once   losartan  50 mg Oral Daily   Continuous Infusions:  sodium chloride 75 mL/hr at 06/11/23 1007   methocarbamol (ROBAXIN) IV Stopped (06/10/23 0902)   PRN Meds: acetaminophen **OR** acetaminophen, HYDROcodone-acetaminophen, methocarbamol (ROBAXIN) IV, morphine injection, ondansetron **OR** ondansetron (ZOFRAN) IV, mouth rinse  Time spent: 35 minutes  Author: Gillis Santa. MD [facility] 06/11/2023 2:21 PM  To reach On-call, see care teams to locate the attending and reach out to them via  [email or web address]. If 7PM-7AM, please contact night-coverage If you still have difficulty reaching the attending provider, please page the [facility] (Director on Call) for [facility] on amion for assistance.

## 2023-06-11 NOTE — TOC Initial Note (Signed)
Transition of Care [facility]) - Initial/Assessment Note    Patient Details  Name: Steven Kramer MRN: [id number] Date of Birth: [birth year]  [facility]) CM/SW Contact:    Margarito Liner, LCSW Phone Number: 06/11/2023, 10:36 AM  Clinical Narrative:   CSW met with patient. No supports at bedside. CSW introduced role and inquired about not having a PCP or insurance which patient confirmed. Provided packet for free/low-cost healthcare in [facility] and intake paperwork for [employer]. CSW sent secure chat to pharmacist to notify him that patient does not have insurance. No further concerns. CSW encouraged patient to contact CSW as needed. CSW will continue to follow patient for support and facilitate return home once stable. He will get an Benedetto Goad at discharge.               Expected Discharge Plan: Home/Self Care Barriers to Discharge: Continued Medical Work up   Patient Goals and CMS Choice            Expected Discharge Plan and Services     Post Acute Care Choice: NA Living arrangements for the past 2 months: Single Family Home                                      Prior Living Arrangements/Services Living arrangements for the past 2 months: Single Family Home   Patient language and need for interpreter reviewed:: Yes Do you feel safe going back to the place where you live?: Yes      Need for Family Participation in Patient Care: Yes (Comment)     Criminal Activity/Legal Involvement Pertinent to Current Situation/Hospitalization: No - Comment as needed  Activities of Daily Living   ADL Screening (condition at time of admission) Independently performs ADLs?: Yes (appropriate for developmental age) Is the patient deaf or have difficulty hearing?: No Does the patient have difficulty seeing, even when wearing glasses/contacts?: No Does the patient have difficulty concentrating, remembering, or making decisions?: No  Permission Sought/Granted                   Emotional Assessment Appearance:: Appears stated age Attitude/Demeanor/Rapport: Engaged, Gracious Affect (typically observed): Accepting, Appropriate, Calm, Pleasant Orientation: : Oriented to Self, Oriented to Place, Oriented to  Time, Oriented to Situation Alcohol / Substance Use: Not Applicable Psych Involvement: No (comment)  Admission diagnosis:  Hypomagnesemia [E83.42] Hyponatremia [E87.1] Gallstones [K80.20] Non-traumatic rhabdomyolysis [M62.82] Patient Active Problem List   Diagnosis Date Noted   Hyponatremia 06/10/2023   Rhabdomyolysis 06/10/2023   Fluid collection right thigh uncertain etiology, possible hematoma, right, initial encounter 06/10/2023   PCP:  Patient, No Pcp Per Pharmacy:   [facility] [phone] Hassell Halim [address] [place] [address] Phone: [phone] Fax: [phone]     Social Determinants of Health (SDOH) Social History: SDOH Screenings   Food Insecurity: No Food Insecurity (06/10/2023)  Housing: Low Risk  (06/10/2023)  Transportation Needs: No Transportation Needs (06/10/2023)  Utilities: Not At Risk (06/10/2023)  Social Connections: Unknown (01/11/2022)   Received from [facility]  Tobacco Use: Medium Risk (06/09/2023)   SDOH Interventions:     Readmission Risk Interventions     No data to display

## 2023-06-11 NOTE — Plan of Care (Signed)

## 2023-06-12 LAB — HEPATIC FUNCTION PANEL
ALT: 49 U/L — ABNORMAL HIGH (ref 0–44)
AST: 61 U/L — ABNORMAL HIGH (ref 15–41)
Albumin: 3 g/dL — ABNORMAL LOW (ref 3.5–5.0)
Alkaline Phosphatase: 32 U/L — ABNORMAL LOW (ref 38–126)
Bilirubin, Direct: 0.2 mg/dL (ref 0.0–0.2)
Indirect Bilirubin: 0.6 mg/dL (ref 0.3–0.9)
Total Bilirubin: 0.8 mg/dL (ref 0.3–1.2)
Total Protein: 6.1 g/dL — ABNORMAL LOW (ref 6.5–8.1)

## 2023-06-12 LAB — CBC
HCT: 37.6 % — ABNORMAL LOW (ref 39.0–52.0)
Hemoglobin: 12.2 g/dL — ABNORMAL LOW (ref 13.0–17.0)
MCH: 26.9 pg (ref 26.0–34.0)
MCHC: 32.4 g/dL (ref 30.0–36.0)
MCV: 82.8 fL (ref 80.0–100.0)
Platelets: 302 10*3/uL (ref 150–400)
RBC: 4.54 MIL/uL (ref 4.22–5.81)
RDW: 16.1 % — ABNORMAL HIGH (ref 11.5–15.5)
WBC: 9.7 10*3/uL (ref 4.0–10.5)
nRBC: 0 % (ref 0.0–0.2)

## 2023-06-12 LAB — BASIC METABOLIC PANEL
Anion gap: 7 (ref 5–15)
BUN: 14 mg/dL (ref 6–20)
CO2: 28 mmol/L (ref 22–32)
Calcium: 7.8 mg/dL — ABNORMAL LOW (ref 8.9–10.3)
Chloride: 100 mmol/L (ref 98–111)
Creatinine, Ser: 0.94 mg/dL (ref 0.61–1.24)
GFR, Estimated: >60 mL/min (ref >60)
Glucose, Bld: 87 mg/dL (ref 70–99)
Potassium: 3.6 mmol/L (ref 3.5–5.1)
Sodium: 135 mmol/L (ref 135–145)

## 2023-06-12 LAB — CK: Total CK: 1526 U/L — ABNORMAL HIGH (ref 49–397)

## 2023-06-12 LAB — PHOSPHORUS: Phosphorus: 2.7 mg/dL (ref 2.5–4.6)

## 2023-06-12 LAB — MAGNESIUM: Magnesium: 2 mg/dL (ref 1.7–2.4)

## 2023-06-12 LAB — OSMOLALITY: Osmolality: 263 mosm/kg — ABNORMAL LOW (ref 275–295)

## 2023-06-12 MED ORDER — SODIUM CHLORIDE 0.9 % IV SOLN: INTRAVENOUS | Status: AC

## 2023-06-12 NOTE — Plan of Care (Signed)

## 2023-06-12 NOTE — Plan of Care (Signed)

## 2023-06-12 NOTE — Progress Notes (Signed)
[facility] Progress Note  Patient: Steven Kramer    WUJ:[id number]  DOA: 06/10/2023     Date of Service: the patient was seen and examined on 06/12/2023  Chief Complaint  Patient presents with   Leg Pain   Brief hospital course: Steven Kramer is a 47 y.o. male with medical history significant for No significant past medical history, who presents by EMS from a truck stop with 3-hour onset of severe right thigh pain associated with swelling.  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  He has been injecting the steroids, alternating into his left and right thigh and has been on and off the steroids for years.  He was previously in his usual state state of health prior to the onset of symptoms. ED course and data review: BP 125/109 with otherwise normal vitals Labs: BMP with sodium 122, potassium 3.4.  Magnesium 1.3 and CK 2061 Lower extremity venous ultrasound done, results unavailable Patient treated with Toradol, oxycodone hydromorphone. Patient was given an NS bolus as well as diazepam and magnesium repletion Hospitalist consulted for admission.    Assessment and Plan:  Hypotonic hyponatremia secondary to polydipsia Patient said that he drinks too much of the water Serum osmolality Sodium level 122---132--135  improved Monitor sodium level   Rhabdomyolysis CK level 2161 on admission, CK 1881-- 1526  gradually improving Continue IV fluid for hydration Trend CK level Continue as needed medication for pain control   Right thigh hematoma Possible due to Recreational injectable anabolic steroid use  Patient denies any trauma or fall Continue PRN medications for pain control ight venous ultrasound showing no DVT but showing "Complex fluid collection in the right medial thigh measuring up to 15.6 cm, favored to represent a hematoma. Follow-up in 4-6 weeks is recommended to ensure resolution and exclude underlying soft  tissue mass" avoided Lovenox/heparin for DVT prophylaxis, continue SCDs  Mildly elevated LFTs, unknown cause Repeat LFTs tomorrow a.m.  Hypertension, patient is not on any medication at home Started losartan 50 mg p.o. daily Monitor BP and titrate medications accordingly  Hypophosphatemia, resolved Hypomagnesemia, resolved Hypocalcemia most likely due to hypoalbuminemia, vitamin D level within normal range. Leukocytosis most likely reactive. Resolved WBC 14.2-11--9.7 wnl  Body mass index is 36.24 kg/m.  Interventions:  Diet: Heart healthy diet DVT Prophylaxis: SCD, pharmacological prophylaxis contraindicated due to hematoma    Advance goals of care discussion: Full code  Family Communication: family was not present at bedside, at the time of interview.  The pt provided permission to discuss medical plan with the family. Opportunity was given to ask question and all questions were answered satisfactorily.   Disposition:  Pt is from Home, admitted with right thigh pain, found to have rhabdomyolysis, hematoma right thigh and hyponatremia, still has elevated CK and low sodium, which precludes a safe discharge. Discharge to home, when stable, most likely in 1 to 2 days.  Subjective: No significant events overnight, still complaining of pain in the right thigh due to hematoma, pain is off and on sometimes it is 7/10 versus 3/10.  No any other complaints.  Patient was advised to move around today and possible discharge tomorrow a.m. if remains stable.   Physical Exam: General: NAD, lying comfortably Appear in no distress, affect appropriate Eyes: PERRLA ENT: Oral Mucosa Clear, moist  Neck: no JVD,  Cardiovascular: S1 and S2 Present, no Murmur,  Respiratory: good respiratory effort, Bilateral Air entry equal and Decreased,  no Crackles, no wheezes Abdomen: Bowel Sound present, Soft and no tenderness,  Skin: no rashes Extremities: no Pedal edema, no calf tenderness, right thigh  tenderness and swelling due to hematoma Neurologic: without any new focal findings Gait not checked due to patient safety concerns  Vitals:   06/11/23 1549 06/11/23 2117 06/12/23 0337 06/12/23 0915  BP: 126/69 127/76 125/66 135/83  Pulse: 98 (!) 106 90 91  Resp: 18 19 17 20   Temp: (!) 97.5 F (36.4 C) 97.7 F (36.5 C) 97.6 F (36.4 C) 97.8 F (36.6 C)  TempSrc: Oral Oral Oral Oral  SpO2: 95% 96% 95% 98%  Weight:      Height:        Intake/Output Summary (Last 24 hours) at 06/12/2023 1147 Last data filed at 06/12/2023 1028 Gross per 24 hour  Intake 2158.91 ml  Output --  Net 2158.91 ml   Filed Weights   06/09/23 2125 06/09/23 2128 06/11/23 1352  Weight: 131.5 kg 131.5 kg 131.5 kg    Data Reviewed: I have personally reviewed and interpreted daily labs, tele strips, imagings as discussed above. I reviewed all nursing notes, pharmacy notes, vitals, pertinent old records I have discussed plan of care as described above with RN and patient/family.  CBC: Recent Labs  Lab 06/10/23 1325 06/11/23 0452 06/12/23 0419  WBC 14.2* 11.0* 9.7  HGB 13.8 12.6* 12.2*  HCT 40.7 37.3* 37.6*  MCV 79.5* 80.0 82.8  PLT 340 293 302   Basic Metabolic Panel: Recent Labs  Lab 06/10/23 0140 06/10/23 1325 06/10/23 1446 06/10/23 2038 06/11/23 0452 06/12/23 0419  NA 122* 123* 125* 127* 132* 135  K 3.4* 3.8 3.7 4.0 3.9 3.6  CL 84* 86* 90* 90* 95* 100  CO2 26 28 27 26 29 28   GLUCOSE 137* 96 122* 86 96 87  BUN 17 18 19 20  22* 14  CREATININE 1.04 1.10 1.06 1.18 1.17 0.94  CALCIUM 8.2* 7.9* 7.7* 7.9* 7.8* 7.8*  MG 1.3* 1.7  --   --  2.0 2.0  PHOS  --  2.4*  --   --  2.8 2.7    Studies: No results found.  Scheduled Meds:  feeding supplement  296 mL Oral Once   influenza vac split trivalent PF  0.5 mL Intramuscular Tomorrow-1000   losartan  50 mg Oral Daily   Continuous Infusions:  sodium chloride     methocarbamol (ROBAXIN) IV Stopped (06/12/23 0233)   PRN Meds:  acetaminophen **OR** acetaminophen, HYDROcodone-acetaminophen, methocarbamol (ROBAXIN) IV, morphine injection, ondansetron **OR** ondansetron (ZOFRAN) IV, mouth rinse  Time spent: 35 minutes  Author: Gillis Santa. MD [facility] 06/12/2023 11:47 AM  To reach On-call, see care teams to locate the attending and reach out to them via [email or web address]. If 7PM-7AM, please contact night-coverage If you still have difficulty reaching the attending provider, please page the [facility] (Director on Call) for [facility] on amion for assistance.

## 2023-06-13 ENCOUNTER — Other Ambulatory Visit: Payer: Self-pay

## 2023-06-13 LAB — CBC
HCT: 38.4 % — ABNORMAL LOW (ref 39.0–52.0)
Hemoglobin: 12.3 g/dL — ABNORMAL LOW (ref 13.0–17.0)
MCH: 26.5 pg (ref 26.0–34.0)
MCHC: 32 g/dL (ref 30.0–36.0)
MCV: 82.8 fL (ref 80.0–100.0)
Platelets: 324 10*3/uL (ref 150–400)
RBC: 4.64 MIL/uL (ref 4.22–5.81)
RDW: 16.2 % — ABNORMAL HIGH (ref 11.5–15.5)
WBC: 8 10*3/uL (ref 4.0–10.5)
nRBC: 0 % (ref 0.0–0.2)

## 2023-06-13 LAB — BASIC METABOLIC PANEL
Anion gap: 9 (ref 5–15)
BUN: 11 mg/dL (ref 6–20)
CO2: 25 mmol/L (ref 22–32)
Calcium: 8.6 mg/dL — ABNORMAL LOW (ref 8.9–10.3)
Chloride: 103 mmol/L (ref 98–111)
Creatinine, Ser: 0.8 mg/dL (ref 0.61–1.24)
GFR, Estimated: >60 mL/min (ref >60)
Glucose, Bld: 93 mg/dL (ref 70–99)
Potassium: 3.9 mmol/L (ref 3.5–5.1)
Sodium: 137 mmol/L (ref 135–145)

## 2023-06-13 LAB — HEPATIC FUNCTION PANEL
ALT: 49 U/L — ABNORMAL HIGH (ref 0–44)
AST: 50 U/L — ABNORMAL HIGH (ref 15–41)
Albumin: 2.9 g/dL — ABNORMAL LOW (ref 3.5–5.0)
Alkaline Phosphatase: 37 U/L — ABNORMAL LOW (ref 38–126)
Bilirubin, Direct: 0.2 mg/dL (ref 0.0–0.2)
Indirect Bilirubin: 0.6 mg/dL (ref 0.3–0.9)
Total Bilirubin: 0.8 mg/dL (ref 0.3–1.2)
Total Protein: 5.8 g/dL — ABNORMAL LOW (ref 6.5–8.1)

## 2023-06-13 LAB — PHOSPHORUS: Phosphorus: 3.7 mg/dL (ref 2.5–4.6)

## 2023-06-13 LAB — CK: Total CK: 960 U/L — ABNORMAL HIGH (ref 49–397)

## 2023-06-13 LAB — MAGNESIUM: Magnesium: 1.7 mg/dL (ref 1.7–2.4)

## 2023-06-13 MED ORDER — ACETAMINOPHEN 325 MG PO TABS: 650.0000 mg | ORAL_TABLET | Freq: Four times a day (QID) | ORAL | Status: AC | PRN

## 2023-06-13 MED ORDER — LOSARTAN POTASSIUM 50 MG PO TABS
50.0000 mg | ORAL_TABLET | Freq: Every day | ORAL | 3 refills | Status: AC
  Filled 2023-06-13: qty 90, 90d supply, fill #0

## 2023-06-13 NOTE — Discharge Summary (Signed)
[facility] Discharge Summary   Patient: Steven Kramer ZOX:[id number]  PCP: Patient, No Pcp Per  Date of admission: 06/10/2023   Date of discharge:  06/13/2023     Discharge Diagnoses:  Principal Problem:   Hyponatremia Active Problems:   Fluid collection right thigh uncertain etiology, possible hematoma, right, initial encounter   Rhabdomyolysis   Admitted From: Home Disposition:  Home   Recommendations for Outpatient Follow-up:  PCP: in 1 wk Follow up LABS/TEST:  BMP and CK in 1 wk   Diet recommendation: Cardiac diet  Activity: The patient is advised to gradually reintroduce usual activities, as tolerated  Discharge Condition: stable  Code Status: Full code   History of present illness: As per the H and P dictated on admission Hospital Course:  Steven Kramer is a 47 y.o. male with medical history significant for No significant past medical history, who [place] by [place] with 3-hour onset of severe right thigh pain associated with swelling.  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  He has been injecting the steroids, alternating into his left and right thigh and has been on and off the steroids for years.  He was previously in his usual state state of health prior to the onset of symptoms. ED course and data review: BP 125/109 with otherwise normal vitals Labs: BMP with sodium 122, potassium 3.4.  Magnesium 1.3 and CK 2061 Lower extremity venous ultrasound done, results unavailable Patient treated with Toradol, oxycodone hydromorphone. Patient was given an NS bolus as well as diazepam and magnesium repletion Hospitalist consulted for admission.      Assessment and Plan: # Hypotonic hyponatremia secondary to polydipsia Patient said that he drinks too much of the water Serum osmolality 263 low, Sodium level 122---137 improved.  Patient was advised to drink adequate water # Rhabdomyolysis CK  level 2161 on admission, CK 1881-- 1526--960  gradually improving S/p IV normal saline given during hospital stay, advised to continue adequate oral hydration.  Follow with PCP and repeat CK level and BMP after 1 week  # Right thigh hematoma: Possible due to Recreational injectable anabolic steroid use  Patient denies any trauma or fall. Continue PRN medications for pain control Right venous ultrasound showing no DVT but showing: "Complex fluid collection in the right medial thigh measuring up to 15.6 cm, favored to represent a hematoma. Follow-up in 4-6 weeks is recommended to ensure resolution and exclude underlying soft tissue mass" avoided Lovenox/heparin for DVT prophylaxis, s/p SCDs, advised to apply ice pack now and then warm compresses at home after few days. # Mildly elevated LFTs, unknown cause.  Gradually trending down, remained stable. # Hypertension, patient is not on any medication at home. Started losartan 50 mg p.o. daily.  BP remained stable, patient was advised to monitor BP at home and follow with PCP for further management as an outpatient. # Hypophosphatemia, resolved # Hypomagnesemia, resolved # Hypocalcemia most likely due to hypoalbuminemia, vitamin D level within normal range. # Leukocytosis most likely reactive. Resolved. WBC 14.2-11--9.7 wnl   Body mass index is 36.24 kg/m.  Nutrition Interventions:  Patient was ambulatory without any assistance. On the day of the discharge the patient's vitals were stable, and no other acute medical condition were reported by patient. the patient was felt safe to be discharge at Home.  Consultants: None Procedures: NOne  Discharge Exam: General: Appear in no distress, no Rash; Oral Mucosa Clear, moist.  Cardiovascular: S1 and S2 Present, no Murmur, Respiratory: normal respiratory effort, Bilateral Air entry present and no Crackles, no wheezes Abdomen: Bowel Sound present, Soft and no tenderness, no hernia Extremities: Right  thigh hematoma and bruises, tender, no Pedal edema, no calf tenderness Neurology: alert and oriented to time, place, and person affect appropriate.  Filed Weights   06/09/23 2125 06/09/23 2128 06/11/23 1352  Weight: 131.5 kg 131.5 kg 131.5 kg   Vitals:   06/13/23 0356 06/13/23 0805  BP: 127/84 122/71  Pulse: 72 83  Resp: 18 18  Temp:  97.9 F (36.6 C)  SpO2: 94% 96%    DISCHARGE MEDICATION: Allergies as of 06/13/2023   No Known Allergies      Medication List     STOP taking these medications    Buprenorphine HCl-Naloxone HCl 8-2 MG Film   hydrochlorothiazide 25 MG tablet Commonly known as: HYDRODIURIL   olmesartan 40 MG tablet Commonly known as: BENICAR   ondansetron 4 MG tablet Commonly known as: Zofran       TAKE these medications    acetaminophen 325 MG tablet Commonly known as: TYLENOL Take 2 tablets (650 mg total) by mouth every 6 (six) hours as needed for mild pain, moderate pain, fever or headache (or Fever >/= 101).   losartan 50 MG tablet Commonly known as: COZAAR Take 1 tablet (50 mg total) by mouth daily. Start taking on: June 14, 2023       No Known Allergies Discharge Instructions     Call MD for:  extreme fatigue   Complete by: As directed    Call MD for:  persistant dizziness or light-headedness   Complete by: As directed    Call MD for:  severe uncontrolled pain   Complete by: As directed    Call MD for:  temperature >100.4   Complete by: As directed    Diet - low sodium heart healthy   Complete by: As directed    Discharge instructions   Complete by: As directed    F/u with PCP in 1 wk Monitor BP at Home. Apply Ice on right thigh hematoma and avoid Ibuprofen   Increase activity slowly   Complete by: As directed        The results of significant diagnostics from this hospitalization (including imaging, microbiology, ancillary and laboratory) are listed below for reference.    Significant Diagnostic Studies: US  Venous Img Lower Unilateral Right  Result Date: 06/10/2023 CLINICAL DATA:  Pain, bruising, swelling right leg EXAM: Right LOWER EXTREMITY VENOUS DOPPLER ULTRASOUND TECHNIQUE: Gray-scale sonography with compression, as well as color and duplex ultrasound, were performed to evaluate the deep venous system(s) from the level of the common femoral vein through the popliteal and proximal calf veins. COMPARISON:  None Available. FINDINGS: VENOUS Normal compressibility of the common femoral, superficial femoral, and popliteal veins, as well as the visualized calf veins. Visualized portions of profunda femoral vein and great saphenous vein unremarkable. No filling defects to suggest DVT on grayscale or color Doppler imaging. Doppler waveforms show normal direction of venous flow, normal respiratory plasticity and response to augmentation. Limited views of the contralateral common femoral vein are unremarkable. OTHER Targeted ultrasound in the area of pain and bruising in the right medial thigh corresponds to a heterogenous predominantly hypoechoic complex fluid collection measuring 15.6 x 3.2 x 4.2 cm. This extends from the upper to lower medial thigh. No internal vascularity. Limitations: none IMPRESSION: 1. No evidence of right lower extremity DVT. 2. Complex  fluid collection in the right medial thigh measuring up to 15.6 cm, favored to represent a hematoma. Follow-up in 4-6 weeks is recommended to ensure resolution and exclude underlying soft tissue mass. Electronically Signed   By: Minerva Fester M.D.   On: 06/10/2023 03:28    Microbiology: No results found for this or any previous visit (from the past 240 hour(s)).   Labs: CBC: Recent Labs  Lab 06/10/23 1325 06/11/23 0452 06/12/23 0419 06/13/23 0351  WBC 14.2* 11.0* 9.7 8.0  HGB 13.8 12.6* 12.2* 12.3*  HCT 40.7 37.3* 37.6* 38.4*  MCV 79.5* 80.0 82.8 82.8  PLT 340 293 302 324   Basic Metabolic Panel: Recent Labs  Lab 06/10/23 0140 06/10/23 1325  06/10/23 1446 06/10/23 2038 06/11/23 0452 06/12/23 0419 06/13/23 0351  NA 122* 123* 125* 127* 132* 135 137  K 3.4* 3.8 3.7 4.0 3.9 3.6 3.9  CL 84* 86* 90* 90* 95* 100 103  CO2 26 28 27 26 29 28 25   GLUCOSE 137* 96 122* 86 96 87 93  BUN 17 18 19 20  22* 14 11  CREATININE 1.04 1.10 1.06 1.18 1.17 0.94 0.80  CALCIUM 8.2* 7.9* 7.7* 7.9* 7.8* 7.8* 8.6*  MG 1.3* 1.7  --   --  2.0 2.0 1.7  PHOS  --  2.4*  --   --  2.8 2.7 3.7   Liver Function Tests: Recent Labs  Lab 06/10/23 1446 06/12/23 0419 06/13/23 0351  AST 72* 61* 50*  ALT 52* 49* 49*  ALKPHOS 34* 32* 37*  BILITOT 1.3* 0.8 0.8  PROT 5.9* 6.1* 5.8*  ALBUMIN 3.2* 3.0* 2.9*   No results for input(s): "LIPASE", "AMYLASE" in the last 168 hours. No results for input(s): "AMMONIA" in the last 168 hours. Cardiac Enzymes: Recent Labs  Lab 06/10/23 0140 06/11/23 0452 06/12/23 0419 06/13/23 0351  CKTOTAL 2,161* 1,881* 1,526* 960*   BNP (last 3 results) Recent Labs    06/11/23 0452  BNP 19.9   CBG: No results for input(s): "GLUCAP" in the last 168 hours.  Time spent: 35 minutes  Signed:  Gillis Santa  [facility] 06/13/2023 10:53 AM

## 2023-06-13 NOTE — Plan of Care (Signed)

## 2023-06-13 NOTE — Progress Notes (Signed)
Discharge instructions were reviewed with patient. IV was removed. Questions were encourage and answered. Belonging collected by patient.

## 2023-06-14 ENCOUNTER — Other Ambulatory Visit: Payer: Self-pay
